# Patient Record
Sex: Female | Born: 1995 | Race: White | Hispanic: No | Marital: Single
Health system: Midwestern US, Community
[De-identification: ages and names within clinical notes are randomized; demographics above are authoritative.]

## PROBLEM LIST (undated history)

## (undated) DIAGNOSIS — I10 Essential (primary) hypertension: Secondary | ICD-10-CM

## (undated) DIAGNOSIS — J45909 Unspecified asthma, uncomplicated: Secondary | ICD-10-CM

## (undated) DIAGNOSIS — K589 Irritable bowel syndrome without diarrhea: Secondary | ICD-10-CM

## (undated) HISTORY — PX: CHOLECYSTECTOMY: SHX55

---
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
## 2019-03-11 ENCOUNTER — Ambulatory Visit (HOSPITAL_COMMUNITY)
Admission: EM | Admit: 2019-03-11 | Discharge: 2019-03-11 | Disposition: A | Discharge status: Home / Self Care | Payer: Federal, State, Local not specified - PPO | Attending: Family Medicine | Admitting: Family Medicine

## 2019-03-11 ENCOUNTER — Other Ambulatory Visit: Payer: Self-pay

## 2019-03-11 ENCOUNTER — Encounter (HOSPITAL_COMMUNITY): Payer: Self-pay

## 2019-03-11 DIAGNOSIS — K591 Functional diarrhea: Secondary | ICD-10-CM | POA: Diagnosis not present

## 2019-03-11 HISTORY — DX: Unspecified asthma, uncomplicated: J45.909

## 2019-03-11 HISTORY — DX: Irritable bowel syndrome without diarrhea: K58.9

## 2019-03-11 HISTORY — DX: Essential (primary) hypertension: I10

## 2019-03-11 MED ORDER — DIPHENOXYLATE-ATROPINE 2.5-0.025 MG PO TABS: 2.0000 | ORAL_TABLET | Freq: Four times a day (QID) | ORAL | 0 refills | Status: DC | PRN

## 2019-03-11 NOTE — ED Triage Notes (Signed)
Pt states she his having ISB flare ups x 3 days.

## 2019-03-11 NOTE — Discharge Instructions (Addendum)
Take Imodium, 2 tablets stat then 1 after each loose stool up to a max of 8/day

## 2019-03-11 NOTE — ED Provider Notes (Signed)
[facility]    CSN: [id number] Arrival date & time: 03/11/19  1308      History   Chief Complaint Chief Complaint  Patient presents with  . Diarrhea    HPI Chelsea Spencer is a 23 y.o. female.   Patient recently relocated to this area.  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  Diarrhea has been present for the past week.  She used small amount of Imodium.  There is also a history of gallbladder disease and sometimes it is hard for her to separate symptoms.  She has found that she needs to avoid fried and fatty and greasy foods.  HPI  Past Medical History:  Diagnosis Date  . Asthma   . Hypertension     There are no active problems to display for this patient.   Past Surgical History:  Procedure Laterality Date  . CHOLECYSTECTOMY      OB History   No obstetric history on file.      Home Medications    Prior to Admission medications   Not on File    Family History History reviewed. No pertinent family history.  Social History Social History   Tobacco Use  . Smoking status: Former Research scientist (life sciences)  . Smokeless tobacco: Never Used  Substance Use Topics  . Alcohol use: Never    Frequency: Never  . Drug use: Not on file     Allergies   Patient has no known allergies.   Review of Systems Review of Systems  Gastrointestinal: Positive for abdominal pain, diarrhea and vomiting.  All other systems reviewed and are negative.    Physical Exam Triage Vital Signs ED Triage Vitals  Enc Vitals Group     BP 03/11/19 1412 130/82     Pulse Rate 03/11/19 1412 71     Resp 03/11/19 1412 18     Temp 03/11/19 1412 98.3 F (36.8 C)     Temp src --      SpO2 03/11/19 1412 100 %     Weight 03/11/19 1410 260 lb (117.9 kg)     Height --      Head Circumference --      Peak Flow --      Pain Score 03/11/19 1410 4     Pain Loc --      Pain Edu? --      Excl. in [place]? --    No data found.  Updated Vital Signs BP 130/82 (BP  Location: Right Arm)   Pulse 71   Temp 98.3 F (36.8 C)   Resp 18   Wt 117.9 kg   LMP 03/11/2019   SpO2 100%   Visual Acuity Right Eye Distance:   Left Eye Distance:   Bilateral Distance:    Right Eye Near:   Left Eye Near:    Bilateral Near:     Physical Exam Vitals signs and nursing note reviewed.  Constitutional:      Appearance: Normal appearance. She is obese.  Cardiovascular:     Rate and Rhythm: Normal rate and regular rhythm.  Pulmonary:     Breath sounds: Normal breath sounds.  Abdominal:     General: Bowel sounds are normal.     Palpations: There is no mass.     Tenderness: There is no abdominal tenderness. There is no guarding or rebound.  Neurological:     Mental Status: She is alert.      UC Treatments / Results  Labs (all labs ordered are listed, but only abnormal results are displayed) Labs Reviewed - No data to display  EKG   Radiology No results found.  Procedures Procedures (including critical care time)  Medications Ordered in UC Medications - No data to display  Initial Impression / Assessment and Plan / UC Course  I have reviewed the triage vital signs and the nursing notes.  Pertinent labs & imaging results that were available during my care of the patient were reviewed by me and considered in my medical decision making (see chart for details).     Likely diagnosis is flareup of her irritable bowel syndrome with diarrhea.  I have suggested that she increase her Imodium taking 2 tablets to start with and 1 after each loose stool per day. Final Clinical Impressions(s) / UC Diagnoses   Final diagnoses:  None   Discharge Instructions   None    ED Prescriptions    None     PDMP not reviewed this encounter.   Frederica Kuster, MD 03/11/19 [phone]

## 2019-03-16 ENCOUNTER — Other Ambulatory Visit: Payer: Self-pay

## 2019-03-16 ENCOUNTER — Encounter (HOSPITAL_COMMUNITY): Payer: Self-pay

## 2019-03-16 ENCOUNTER — Ambulatory Visit (HOSPITAL_COMMUNITY)
Admission: EM | Admit: 2019-03-16 | Discharge: 2019-03-16 | Disposition: A | Discharge status: Home / Self Care | Payer: Federal, State, Local not specified - PPO | Attending: Family Medicine | Admitting: Family Medicine

## 2019-03-16 DIAGNOSIS — Z20822 Contact with and (suspected) exposure to covid-19: Secondary | ICD-10-CM

## 2019-03-16 DIAGNOSIS — J069 Acute upper respiratory infection, unspecified: Secondary | ICD-10-CM | POA: Diagnosis present

## 2019-03-16 DIAGNOSIS — R062 Wheezing: Secondary | ICD-10-CM | POA: Insufficient documentation

## 2019-03-16 DIAGNOSIS — J029 Acute pharyngitis, unspecified: Secondary | ICD-10-CM | POA: Diagnosis not present

## 2019-03-16 DIAGNOSIS — Z20828 Contact with and (suspected) exposure to other viral communicable diseases: Secondary | ICD-10-CM | POA: Diagnosis present

## 2019-03-16 LAB — POCT RAPID STREP A: Streptococcus, Group A Screen (Direct): NEGATIVE

## 2019-03-16 MED ORDER — PREDNISONE 20 MG PO TABS: 20.0000 mg | ORAL_TABLET | Freq: Two times a day (BID) | ORAL | 0 refills | Status: DC

## 2019-03-16 MED ORDER — ALBUTEROL SULFATE HFA 108 (90 BASE) MCG/ACT IN AERS: 1.0000 | INHALATION_SPRAY | Freq: Four times a day (QID) | RESPIRATORY_TRACT | 0 refills | Status: DC | PRN

## 2019-03-16 MED ORDER — ALBUTEROL SULFATE HFA 108 (90 BASE) MCG/ACT IN AERS: 1.0000 | INHALATION_SPRAY | Freq: Four times a day (QID) | RESPIRATORY_TRACT | Status: DC | PRN

## 2019-03-16 NOTE — ED Triage Notes (Signed)
Pt report cough, fever and sore throat.

## 2019-03-16 NOTE — Discharge Instructions (Addendum)
Drink plenty of fluids Take Tylenol for pain and fever Use albuterol as needed for any wheezing Take prednisone 2 times a day for 5 days. Th e coronavirus test will be helpful in 2 to 3 days.  We will call you if it is positive. Go home and quarantine until your test results are available

## 2019-03-16 NOTE — ED Provider Notes (Addendum)
[facility]    CSN: [id number] Arrival date & time: 03/16/19  1955      History   Chief Complaint Chief Complaint  Patient presents with  . Cough  . Sore Throat  . Fever    HPI Chelsea Spencer is a 23 y.o. female.   HPI  Patient works in a nursing home.  She started feeling sick today with cough fever and sore throat.  She is having bad coughing spells.  She is having shortness of breath.  She had a rapid coronavirus test performed at work today.  It was negative.  She was sent in for medical evaluation.  She feels fatigued.  No sweats or chills.  No body aches.  She does have a history of asthma when she was younger. Increased risk factors for coronavirus include asthma and morbid obesity  Past Medical History:  Diagnosis Date  . Asthma   . Hypertension   . IBS (irritable bowel syndrome)     There are no active problems to display for this patient.   Past Surgical History:  Procedure Laterality Date  . CHOLECYSTECTOMY      OB History   No obstetric history on file.      Home Medications    Prior to Admission medications   Medication Sig Start Date End Date Taking? Authorizing Provider  albuterol (VENTOLIN HFA) 108 (90 Base) MCG/ACT inhaler Inhale 1-2 puffs into the lungs every 6 (six) hours as needed for wheezing or shortness of breath. 03/16/19   Eustace Moore, MD  diphenoxylate-atropine (LOMOTIL) 2.5-0.025 MG tablet Take 2 tablets by mouth 4 (four) times daily as needed for diarrhea or loose stools. 03/11/19   Frederica Kuster, MD  predniSONE (DELTASONE) 20 MG tablet Take 1 tablet (20 mg total) by mouth 2 (two) times daily with a meal. 03/16/19   Eustace Moore, MD    Family History History reviewed. No pertinent family history.  Social History Social History   Tobacco Use  . Smoking status: Former Games developer  . Smokeless tobacco: Never Used  Substance Use Topics  . Alcohol use: Never    Frequency: Never  . Drug use: Not on file     Allergies   Penicillins   Review of Systems Review of Systems  Constitutional: Positive for fatigue. Negative for chills and fever.  HENT: Positive for sore throat. Negative for ear pain.   Eyes: Negative for pain and visual disturbance.  Respiratory: Positive for cough, shortness of breath and wheezing.   Cardiovascular: Negative for chest pain and palpitations.  Gastrointestinal: Negative for abdominal pain and vomiting.  Genitourinary: Negative for dysuria and hematuria.  Musculoskeletal: Negative for arthralgias and back pain.  Skin: Negative for color change and rash.  Neurological: Negative for seizures and syncope.  All other systems reviewed and are negative.    Physical Exam Triage Vital Signs ED Triage Vitals  Enc Vitals Group     BP 03/16/19 2007 (!) 150/77     Pulse Rate 03/16/19 2007 98     Resp 03/16/19 2007 16     Temp 03/16/19 2007 98.7 F (37.1 C)     Temp Source 03/16/19 2007 Oral     SpO2 03/16/19 2007 97 %     Weight --      Height --      Head Circumference --      Peak Flow --      Pain Score 03/16/19 2005 8     Pain  Loc --      Pain Edu? --      Excl. in GC? --    No data found.  Updated Vital Signs BP (!) 150/77 (BP Location: Right Arm)   Pulse 98   Temp 98.7 F (37.1 C) (Oral)   Resp 16   LMP 03/11/2019   SpO2 97%   Visual Acuity Right Eye Distance:   Left Eye Distance:   Bilateral Distance:    Right Eye Near:   Left Eye Near:    Bilateral Near:     Physical Exam Constitutional:      General: She is not in acute distress.    Appearance: She is well-developed. She is obese. She is ill-appearing.  HENT:     Head: Normocephalic and atraumatic.     Right Ear: Tympanic membrane and ear canal normal.     Left Ear: Tympanic membrane and ear canal normal.     Nose: No congestion.     Mouth/Throat:     Mouth: Mucous membranes are moist.     Pharynx: Uvula midline. No posterior oropharyngeal erythema.  Eyes:      Conjunctiva/sclera: Conjunctivae normal.     Pupils: Pupils are equal, round, and reactive to light.  Neck:     Musculoskeletal: Normal range of motion.  Cardiovascular:     Rate and Rhythm: Normal rate and regular rhythm.     Heart sounds: Normal heart sounds.  Pulmonary:     Effort: Pulmonary effort is normal. No respiratory distress.     Breath sounds: Wheezing and rhonchi present.     Comments: Scattered wheeze and rhonchi throughout Abdominal:     General: Bowel sounds are normal. There is no distension.     Palpations: Abdomen is soft.     Tenderness: There is no abdominal tenderness.  Musculoskeletal: Normal range of motion.  Skin:    General: Skin is warm and dry.  Neurological:     Mental Status: She is alert.     Gait: Gait normal.  Psychiatric:        Mood and Affect: Mood normal.        Behavior: Behavior normal.      UC Treatments / Results  Labs (all labs ordered are listed, but only abnormal results are displayed) Labs Reviewed  NOVEL CORONAVIRUS, NAA (HOSP ORDER, SEND-OUT TO REF LAB; TAT 18-24 HRS)  CULTURE, GROUP A STREP [facility])  POCT RAPID STREP A    EKG   Radiology No results found.  Procedures Procedures (including critical care time)  Medications Ordered in UC Medications - No data to display  Initial Impression / Assessment and Plan / UC Course  I have reviewed the triage vital signs and the nursing notes.  Pertinent labs & imaging results that were available during my care of the patient were reviewed by me and considered in my medical decision making (see chart for details).     We will give albuterol for the wheezing.  5 days of prednisone for the wheezing.  I believe this is more of an upper respiratory virus, not likely to be coronavirus.  We did do coronavirus testing and have asked her to quarantine until test result is available. Final Clinical Impressions(s) / UC Diagnoses   Final diagnoses:  Viral upper respiratory tract  infection  Wheezing  Suspected COVID-19 virus infection     Discharge Instructions     Drink plenty of fluids Take Tylenol for pain and fever Use albuterol as needed  for any wheezing Take prednisone 2 times a day for 5 days. Th e coronavirus test will be helpful in 2 to 3 days.  We will call you if it is positive. Go home and quarantine until your test results are available    ED Prescriptions    Medication Sig Dispense Auth. Provider   predniSONE (DELTASONE) 20 MG tablet Take 1 tablet (20 mg total) by mouth 2 (two) times daily with a meal. 10 tablet Raylene Everts, MD   albuterol (VENTOLIN HFA) 108 (90 Base) MCG/ACT inhaler  (Status: Discontinued) Inhale 1-2 puffs into the lungs every 6 (six) hours as needed for wheezing or shortness of breath. 18 g Raylene Everts, MD   albuterol (VENTOLIN HFA) 108 (90 Base) MCG/ACT inhaler  (Status: Discontinued) Inhale 1-2 puffs into the lungs every 6 (six) hours as needed for wheezing or shortness of breath. 18 g Raylene Everts, MD   albuterol (VENTOLIN HFA) 108 (90 Base) MCG/ACT inhaler Inhale 1-2 puffs into the lungs every 6 (six) hours as needed for wheezing or shortness of breath. 18 g Raylene Everts, MD     PDMP not reviewed this encounter.   Raylene Everts, MD 03/16/19 2026    Raylene Everts, MD 03/16/19 2027

## 2019-03-19 LAB — CULTURE, GROUP A STREP (THRC)

## 2019-03-19 LAB — NOVEL CORONAVIRUS, NAA (HOSP ORDER, SEND-OUT TO REF LAB; TAT 18-24 HRS): SARS-CoV-2, NAA: NOT DETECTED

## 2019-06-06 ENCOUNTER — Encounter (HOSPITAL_COMMUNITY): Payer: Self-pay | Admitting: Emergency Medicine

## 2019-06-06 ENCOUNTER — Ambulatory Visit (HOSPITAL_COMMUNITY)
Admission: EM | Admit: 2019-06-06 | Discharge: 2019-06-06 | Disposition: A | Discharge status: Home / Self Care | Payer: Federal, State, Local not specified - PPO | Attending: Family Medicine | Admitting: Family Medicine

## 2019-06-06 ENCOUNTER — Other Ambulatory Visit: Payer: Self-pay

## 2019-06-06 DIAGNOSIS — I10 Essential (primary) hypertension: Secondary | ICD-10-CM | POA: Diagnosis not present

## 2019-06-06 DIAGNOSIS — Z20828 Contact with and (suspected) exposure to other viral communicable diseases: Secondary | ICD-10-CM | POA: Insufficient documentation

## 2019-06-06 DIAGNOSIS — R05 Cough: Secondary | ICD-10-CM | POA: Diagnosis present

## 2019-06-06 DIAGNOSIS — J45909 Unspecified asthma, uncomplicated: Secondary | ICD-10-CM | POA: Diagnosis not present

## 2019-06-06 DIAGNOSIS — R062 Wheezing: Secondary | ICD-10-CM | POA: Diagnosis present

## 2019-06-06 DIAGNOSIS — Z87891 Personal history of nicotine dependence: Secondary | ICD-10-CM | POA: Diagnosis not present

## 2019-06-06 DIAGNOSIS — Z880 Allergy status to penicillin: Secondary | ICD-10-CM | POA: Diagnosis not present

## 2019-06-06 DIAGNOSIS — J019 Acute sinusitis, unspecified: Secondary | ICD-10-CM | POA: Diagnosis present

## 2019-06-06 DIAGNOSIS — R059 Cough, unspecified: Secondary | ICD-10-CM

## 2019-06-06 MED ORDER — DOXYCYCLINE HYCLATE 100 MG PO CAPS: 100.0000 mg | ORAL_CAPSULE | Freq: Two times a day (BID) | ORAL | 0 refills | Status: DC

## 2019-06-06 MED ORDER — BENZONATATE 100 MG PO CAPS: 100.0000 mg | ORAL_CAPSULE | Freq: Three times a day (TID) | ORAL | 0 refills | Status: AC | PRN

## 2019-06-06 MED ORDER — PROMETHAZINE-DM 6.25-15 MG/5ML PO SYRP: 5.0000 mL | ORAL_SOLUTION | Freq: Every evening | ORAL | 0 refills | Status: AC | PRN

## 2019-06-06 MED ORDER — ALBUTEROL SULFATE HFA 108 (90 BASE) MCG/ACT IN AERS: 1.0000 | INHALATION_SPRAY | Freq: Four times a day (QID) | RESPIRATORY_TRACT | 0 refills | Status: AC | PRN

## 2019-06-06 MED ORDER — PREDNISONE 20 MG PO TABS: ORAL_TABLET | ORAL | 0 refills | Status: AC

## 2019-06-06 NOTE — ED Triage Notes (Signed)
PT reports coughing, sneezing, wheezing, productive cough. Symptoms for 2 weeks.   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 She has had negative thus far. She got tested yesterday, does not know results. Elects to get tested here.

## 2019-06-06 NOTE — Discharge Instructions (Signed)
Start an antihistamine like Zyrtec (cetirizine) at 10mg daily for postnasal drainage, sinus congestion.  You can take this together with pseudoephedrine (Sudafed) at a dose of 60 mg 3 times a day or twice daily as needed for the same kind of congestion. 

## 2019-06-06 NOTE — ED Provider Notes (Signed)
[facility]   MRN: [id number] DOB: Oct 13, 1995  Subjective:   Chelsea Spencer is a 23 y.o. female presenting for 2 week hx of persistent productive cough, wheezing, shortness of breath. Has also had difficulty with sneezing multiple times every day.  Has a hx of childhood asthma, hx of allergies. Was given an albuterol inhaler and has helped. Has been using benadryl and Aleve with some relief. 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 Has a hx of smoking, quit 1 year ago.   No current facility-administered medications for this encounter.  Current Outpatient Medications:  .  albuterol (VENTOLIN HFA) 108 (90 Base) MCG/ACT inhaler, Inhale 1-2 puffs into the lungs every 6 (six) hours as needed for wheezing or shortness of breath., Disp: 18 g, Rfl: 0   Allergies  Allergen Reactions  . Penicillins Anaphylaxis and Nausea And Vomiting    Past Medical History:  Diagnosis Date  . Asthma   . Hypertension   . IBS (irritable bowel syndrome)      Past Surgical History:  Procedure Laterality Date  . CHOLECYSTECTOMY      No family history on file.  Social History   Tobacco Use  . Smoking status: Former Games developer  . Smokeless tobacco: Never Used  Substance Use Topics  . Alcohol use: Never  . Drug use: Not on file    Review of Systems  Constitutional: Negative for fever and malaise/fatigue.  HENT: Positive for congestion. Negative for ear discharge, ear pain, sinus pain, sore throat and tinnitus.   Eyes: Negative for discharge and redness.  Respiratory: Positive for cough, shortness of breath and wheezing. Negative for hemoptysis.   Cardiovascular: Positive for chest pain (from coughing).  Gastrointestinal: Negative for abdominal pain, diarrhea, nausea and vomiting.  Genitourinary: Negative for dysuria, flank pain and hematuria.  Musculoskeletal: Positive for myalgias (from her coughing).  Skin: Negative for rash.  Neurological: Negative for  dizziness, weakness and headaches.  Psychiatric/Behavioral: Negative for depression and substance abuse.     Objective:   Vitals: BP 139/89   Pulse 89   Temp 98.1 F (36.7 C) (Oral)   Resp 16   LMP 05/11/2019   SpO2 100%   Physical Exam Constitutional:      General: She is not in acute distress.    Appearance: Normal appearance. She is well-developed. She is not ill-appearing, toxic-appearing or diaphoretic.  HENT:     Head: Normocephalic and atraumatic.     Nose: Congestion and rhinorrhea present.     Comments: Erythematous nasal mucosa with purulent discharge.    Mouth/Throat:     Mouth: Mucous membranes are moist.     Pharynx: Posterior oropharyngeal erythema (with pnd) present. No oropharyngeal exudate.  Eyes:     General: No scleral icterus.       Right eye: No discharge.        Left eye: No discharge.     Extraocular Movements: Extraocular movements intact.     Pupils: Pupils are equal, round, and reactive to light.  Cardiovascular:     Rate and Rhythm: Normal rate and regular rhythm.     Pulses: Normal pulses.     Heart sounds: Normal heart sounds. No murmur. No friction rub. No gallop.   Pulmonary:     Effort: Pulmonary effort is normal. No respiratory distress.     Breath sounds: Normal breath sounds. No stridor. No wheezing, rhonchi or rales.  Skin:    General: Skin is warm and  dry.     Findings: No rash.  Neurological:     Mental Status: She is alert and oriented to person, place, and time.  Psychiatric:        Mood and Affect: Mood normal.        Behavior: Behavior normal.        Thought Content: Thought content normal.        Judgment: Judgment normal.     Assessment and Plan :   1. Acute non-recurrent sinusitis, unspecified location   2. Cough   3. Wheezing     Will cover for sinusitis with doxycycline given her allergies.  Also will use prednisone course to help with her cough, sinusitis, wheezing and shortness of breath.  Refilled her  albuterol inhaler.  Use supportive care medications otherwise. Counseled patient on potential for adverse effects with medications prescribed/recommended today, ER and return-to-clinic precautions discussed, patient verbalized understanding.    Jaynee Eagles, [place] 06/06/19 [phone]

## 2019-06-08 LAB — NOVEL CORONAVIRUS, NAA (HOSP ORDER, SEND-OUT TO REF LAB; TAT 18-24 HRS): SARS-CoV-2, NAA: NOT DETECTED

## 2019-11-06 ENCOUNTER — Other Ambulatory Visit: Payer: Self-pay

## 2019-11-06 ENCOUNTER — Emergency Department (HOSPITAL_COMMUNITY): Payer: Federal, State, Local not specified - PPO

## 2019-11-06 ENCOUNTER — Emergency Department (HOSPITAL_COMMUNITY)
Admission: EM | Admit: 2019-11-06 | Discharge: 2019-11-06 | Disposition: A | Discharge status: Home / Self Care | Payer: Federal, State, Local not specified - PPO | Attending: Emergency Medicine | Admitting: Emergency Medicine

## 2019-11-06 ENCOUNTER — Encounter (HOSPITAL_COMMUNITY): Payer: Self-pay

## 2019-11-06 DIAGNOSIS — I10 Essential (primary) hypertension: Secondary | ICD-10-CM | POA: Insufficient documentation

## 2019-11-06 DIAGNOSIS — Z87891 Personal history of nicotine dependence: Secondary | ICD-10-CM | POA: Diagnosis not present

## 2019-11-06 DIAGNOSIS — J45909 Unspecified asthma, uncomplicated: Secondary | ICD-10-CM | POA: Insufficient documentation

## 2019-11-06 DIAGNOSIS — R1011 Right upper quadrant pain: Secondary | ICD-10-CM | POA: Diagnosis not present

## 2019-11-06 DIAGNOSIS — Z79899 Other long term (current) drug therapy: Secondary | ICD-10-CM | POA: Diagnosis not present

## 2019-11-06 DIAGNOSIS — R112 Nausea with vomiting, unspecified: Secondary | ICD-10-CM | POA: Diagnosis not present

## 2019-11-06 DIAGNOSIS — R197 Diarrhea, unspecified: Secondary | ICD-10-CM | POA: Insufficient documentation

## 2019-11-06 LAB — LIPASE, BLOOD: Lipase: 27 U/L (ref 11–51)

## 2019-11-06 LAB — TROPONIN I (HIGH SENSITIVITY)
Troponin I (High Sensitivity): 2 ng/L (ref <18)
Troponin I (High Sensitivity): <2 ng/L (ref <18)

## 2019-11-06 LAB — URINALYSIS, ROUTINE W REFLEX MICROSCOPIC
Bilirubin Urine: NEGATIVE
Glucose, UA: NEGATIVE mg/dL
Hgb urine dipstick: NEGATIVE
Ketones, ur: NEGATIVE mg/dL
Leukocytes,Ua: NEGATIVE
Nitrite: NEGATIVE
Protein, ur: NEGATIVE mg/dL
Specific Gravity, Urine: 1.019 (ref 1.005–1.030)
pH: 7 (ref 5.0–8.0)

## 2019-11-06 LAB — COMPREHENSIVE METABOLIC PANEL
ALT: 77 U/L — ABNORMAL HIGH (ref 0–44)
AST: 59 U/L — ABNORMAL HIGH (ref 15–41)
Albumin: 4 g/dL (ref 3.5–5.0)
Alkaline Phosphatase: 63 U/L (ref 38–126)
Anion gap: 10 (ref 5–15)
BUN: 6 mg/dL (ref 6–20)
CO2: 24 mmol/L (ref 22–32)
Calcium: 9.4 mg/dL (ref 8.9–10.3)
Chloride: 105 mmol/L (ref 98–111)
Creatinine, Ser: 0.61 mg/dL (ref 0.44–1.00)
GFR calc Af Amer: >60 mL/min (ref >60)
GFR calc non Af Amer: >60 mL/min (ref >60)
Glucose, Bld: 97 mg/dL (ref 70–99)
Potassium: 4.5 mmol/L (ref 3.5–5.1)
Sodium: 139 mmol/L (ref 135–145)
Total Bilirubin: 0.6 mg/dL (ref 0.3–1.2)
Total Protein: 7.7 g/dL (ref 6.5–8.1)

## 2019-11-06 LAB — I-STAT BETA HCG BLOOD, ED (MC, WL, AP ONLY): I-stat hCG, quantitative: <5 m[IU]/mL (ref <5)

## 2019-11-06 LAB — CBC
HCT: 41.5 % (ref 36.0–46.0)
Hemoglobin: 13.4 g/dL (ref 12.0–15.0)
MCH: 28 pg (ref 26.0–34.0)
MCHC: 32.3 g/dL (ref 30.0–36.0)
MCV: 86.8 fL (ref 80.0–100.0)
Platelets: 263 10*3/uL (ref 150–400)
RBC: 4.78 MIL/uL (ref 3.87–5.11)
RDW: 13.1 % (ref 11.5–15.5)
WBC: 8 10*3/uL (ref 4.0–10.5)
nRBC: 0 % (ref 0.0–0.2)

## 2019-11-06 MED ORDER — HYDROCODONE-ACETAMINOPHEN 5-325 MG PO TABS: 1.0000 | ORAL_TABLET | ORAL | 0 refills | Status: AC | PRN

## 2019-11-06 MED ORDER — ONDANSETRON HCL 4 MG/2ML IJ SOLN
4.0000 mg | Freq: Once | INTRAMUSCULAR | Status: AC
  Administered 2019-11-06: 4 mg via INTRAVENOUS
  Filled 2019-11-06: qty 2

## 2019-11-06 MED ORDER — SODIUM CHLORIDE 0.9% FLUSH
3.0000 mL | Freq: Once | INTRAVENOUS | Status: AC
  Administered 2019-11-06: 3 mL via INTRAVENOUS

## 2019-11-06 MED ORDER — FENTANYL CITRATE (PF) 100 MCG/2ML IJ SOLN
100.0000 ug | INTRAMUSCULAR | Status: DC | PRN
  Administered 2019-11-06: 100 ug via INTRAVENOUS
  Filled 2019-11-06: qty 2

## 2019-11-06 MED ORDER — SODIUM CHLORIDE 0.9 % IV BOLUS
1000.0000 mL | Freq: Once | INTRAVENOUS | Status: AC
  Administered 2019-11-06: 1000 mL via INTRAVENOUS

## 2019-11-06 MED ORDER — ONDANSETRON HCL 8 MG PO TABS: 8.0000 mg | ORAL_TABLET | Freq: Three times a day (TID) | ORAL | 0 refills | Status: AC | PRN

## 2019-11-06 MED ORDER — HYDROCODONE-ACETAMINOPHEN 5-325 MG PO TABS: 1.0000 | ORAL_TABLET | ORAL | 0 refills | Status: DC | PRN

## 2019-11-06 NOTE — Discharge Instructions (Signed)
The testing did not show gallbladder problems today.  You continue to have hepatic steatosis, which is not likely to be a painful condition.  We are prescribing a pain reliever and nausea pill to take to improve your symptoms.  Do not drive when taking the narcotic pain reliever.  Start with a simple clear liquid diet and gradually advance to regular foods as tolerated.

## 2019-11-06 NOTE — ED Triage Notes (Signed)
Patient complains of RLQ pain with radiation to back since Saturday with vomiting and diarrhea. Patient states she thinks its her gallbladder

## 2019-11-06 NOTE — ED Notes (Signed)
Pt transported to Ultrasound.  

## 2019-11-06 NOTE — ED Provider Notes (Signed)
[facility] EM[facility]ENC[facility] DEPARTMENT Provider Note   CSN: [id number] Arrival date & time: 11/06/19  1022     History Chief Complaint  Patient presents with  . Abdominal Pain    Chelsea Spencer is a 24 y.o. female.  HPI She presents for evaluation of persistent right upper quadrant abdominal pain present for 2 days, with associated nausea, vomiting and diarrhea.  No blood in emesis or stool.  No fever.  Similar problem about 7 years ago when she was evaluated for both kidney stones and gallbladder problems.  No other problems with kidney stones none since that time.  She has not had any surgery on her abdomen.  She has not been able eat for several days because of nausea.  There are no other known modifying factors.    Past Medical History:  Diagnosis Date  . Asthma   . Hypertension   . IBS (irritable bowel syndrome)     There are no problems to display for this patient.   Past Surgical History:  Procedure Laterality Date  . CHOLECYSTECTOMY       OB History   No obstetric history on file.     No family history on file.  Social History   Tobacco Use  . Smoking status: Former Research scientist (life sciences)  . Smokeless tobacco: Never Used  Substance Use Topics  . Alcohol use: Never  . Drug use: Not on file    Home Medications Prior to Admission medications   Medication Sig Start Date End Date Taking? Authorizing Provider  albuterol (VENTOLIN HFA) 108 (90 Base) MCG/ACT inhaler Inhale 1-2 puffs into the lungs every 6 (six) hours as needed for wheezing or shortness of breath. 06/06/19   Jaynee Eagles, PA-C  benzonatate (TESSALON) 100 MG capsule Take 1-2 capsules (100-200 mg total) by mouth 3 (three) times daily as needed. 06/06/19   Jaynee Eagles, PA-C  doxycycline (VIBRAMYCIN) 100 MG capsule Take 1 capsule (100 mg total) by mouth 2 (two) times daily. 06/06/19   Jaynee Eagles, PA-C  HYDROcodone-acetaminophen (NORCO/VICODIN) 5-325 MG tablet Take 1 tablet by mouth every 4 (four) hours  as needed for moderate pain. 11/06/19   Daleen Bo, MD  ondansetron (ZOFRAN) 8 MG tablet Take 1 tablet (8 mg total) by mouth every 8 (eight) hours as needed for nausea or vomiting. 11/06/19   Daleen Bo, MD  predniSONE (DELTASONE) 20 MG tablet Take 2 tablets daily with breakfast. 06/06/19   Jaynee Eagles, PA-C  promethazine-dextromethorphan (PROMETHAZINE-DM) 6.25-15 MG/5ML syrup Take 5 mLs by mouth at bedtime as needed for cough. 06/06/19   Jaynee Eagles, PA-C    Allergies    Penicillins  Review of Systems   Review of Systems  All other systems reviewed and are negative.   Physical Exam Updated Vital Signs BP 100/66   Pulse 62   Temp 98 F (36.7 C) (Oral)   Resp 19   Ht 5\' 7"  (1.702 m)   Wt 117.9 kg   SpO2 97%   BMI 40.72 kg/m   Physical Exam Vitals and nursing note reviewed.  Constitutional:      General: She is not in acute distress.    Appearance: She is well-developed. She is obese. She is not ill-appearing, toxic-appearing or diaphoretic.  HENT:     Head: Normocephalic and atraumatic.     Right Ear: External ear normal.     Left Ear: External ear normal.  Eyes:     Conjunctiva/sclera: Conjunctivae normal.     Pupils: Pupils are  equal, round, and reactive to light.  Neck:     Trachea: Phonation normal.  Cardiovascular:     Rate and Rhythm: Normal rate and regular rhythm.     Heart sounds: Normal heart sounds.  Pulmonary:     Effort: Pulmonary effort is normal.     Breath sounds: Normal breath sounds.  Abdominal:     General: There is no distension.     Palpations: Abdomen is soft. There is no mass.     Tenderness: There is abdominal tenderness (Right upper quadrant, moderate). There is guarding. There is no rebound.  Musculoskeletal:        General: Normal range of motion.     Cervical back: Normal range of motion and neck supple.  Skin:    General: Skin is warm and dry.  Neurological:     Mental Status: She is alert and oriented to person, place, and  time.     Cranial Nerves: No cranial nerve deficit.     Sensory: No sensory deficit.     Motor: No abnormal muscle tone.     Coordination: Coordination normal.  Psychiatric:        Mood and Affect: Mood normal.        Behavior: Behavior normal.        Thought Content: Thought content normal.        Judgment: Judgment normal.     ED Results / Procedures / Treatments   Labs (all labs ordered are listed, but only abnormal results are displayed) Labs Reviewed  COMPREHENSIVE METABOLIC PANEL - Abnormal; Notable for the following components:      Result Value   AST 59 (*)    ALT 77 (*)    All other components within normal limits  URINALYSIS, ROUTINE W REFLEX MICROSCOPIC - Abnormal; Notable for the following components:   APPearance HAZY (*)    All other components within normal limits  LIPASE, BLOOD  CBC  I-STAT BETA HCG BLOOD, ED ([facility], [facility], [facility] ONLY)  TROPONIN I (HIGH SENSITIVITY)  TROPONIN I (HIGH SENSITIVITY)    EKG EKG Interpretation  Date/Time:  Monday Nov 06 2019 10:57:32 EDT Ventricular Rate:  69 PR Interval:    QRS Duration: 99 QT Interval:  409 QTC Calculation: 439 R Axis:   37 Text Interpretation: Sinus rhythm Abnormal Q suggests inferior infarct No old tracing to compare Confirmed by Mancel Bale [phone]) on 11/06/2019 11:13:06 AM   Radiology US Abdomen Complete  Result Date: 11/06/2019 CLINICAL DATA:  24 year old female with acute abdominal pain for 2 days. EXAM: ABDOMEN ULTRASOUND COMPLETE COMPARISON:  None. FINDINGS: Gallbladder: The gallbladder is unremarkable. There is no evidence of cholelithiasis or acute cholecystitis. Common bile duct: Diameter: 4 mm. No intrahepatic or extrahepatic biliary dilatation. Liver: Increased hepatic echogenicity is compatible with hepatic steatosis. Focal sparing adjacent to the gallbladder is noted. No suspicious focal hepatic abnormalities are present. Portal vein is patent on color Doppler imaging with normal direction of blood  flow towards the liver. IVC: No abnormality visualized. Pancreas: Visualized portion unremarkable. Spleen: Size and appearance within normal limits. Right Kidney: Length: 12 cm. Echogenicity within normal limits. No mass or hydronephrosis visualized. Left Kidney: Length: 12.9 cm. Echogenicity within normal limits. No mass or hydronephrosis visualized. Abdominal aorta: No aneurysm visualized. Other findings: None. IMPRESSION: 1. No evidence of acute abnormality.  Unremarkable gallbladder. 2. Hepatic steatosis. Electronically Signed   By: Harmon Pier M.D.   On: 11/06/2019 14:03    Procedures Procedures (including critical care  time)  Medications Ordered in ED Medications  fentaNYL (SUBLIMAZE) injection 100 mcg (100 mcg Intravenous Given 11/06/19 1225)  sodium chloride flush (NS) 0.9 % injection 3 mL (3 mLs Intravenous Given 11/06/19 1228)  ondansetron (ZOFRAN) injection 4 mg (4 mg Intravenous Given 11/06/19 1224)  sodium chloride 0.9 % bolus 1,000 mL (0 mLs Intravenous Stopped 11/06/19 1444)    ED Course  I have reviewed the triage vital signs and the nursing notes.  Pertinent labs & imaging results that were available during my care of the patient were reviewed by me and considered in my medical decision making (see chart for details).  Clinical Course as of Nov 06 1531  Mon Nov 06, 2019  1514 Normal delta troponin  Troponin I (High Sensitivity) [EW]  1514 Normal  CBC [EW]  1514 Normal   [EW]  1514 Minimal elevation in AST and ALT, otherwise normal  Comprehensive metabolic panel(!) [EW]  1514 Normal  Lipase, blood [EW]  1514 Normal  Urinalysis, Routine w reflex microscopic(!) [EW]  1514 Per radiologist, no acute changes, hepatic steatosis is present  US Abdomen Complete [EW]  1516 MCH: 28.0 [EW]    Clinical Course User Index [EW] Mancel Bale, MD   MDM Rules/Calculators/A&P                       Patient Vitals for the past 24 hrs:  BP Temp Temp src Pulse Resp SpO2 Height  Weight  11/06/19 1315 100/66 -- -- 62 19 97 % -- --  11/06/19 1300 110/72 -- -- 65 17 96 % -- --  11/06/19 1249 123/87 -- -- 83 16 97 % -- --  11/06/19 1026 (!) 148/86 98 F (36.7 C) Oral 78 18 98 % 5\' 7"  (1.702 m) 117.9 kg    3:33 PM Reevaluation with update and discussion. After initial assessment and treatment, an updated evaluation reveals she appears more comfortable now, and has no further complaints.  Findings discussed and questions answered.   Medical Decision Making:  This patient is presenting for evaluation of abdominal pain, nausea and vomiting, which does require a range of treatment options, and is a complaint that involves a moderate risk of morbidity and mortality. The differential diagnoses include gallbladder disease, peptic ulcer, cardiac disorder, enteritis. I decided to review old records, and in summary healthy obese female with history of hepatic disorder, status post liver biopsy.  I did not require additional historical information from anyone.  Clinical Laboratory Tests Ordered, included CBC, Metabolic panel and Pregnancy test, troponin, urinalysis. Review indicates essentially normal except mild elevation of AST and ALT.Mancel Bale Radiologic Tests Ordered, included abdominal ultrasound.  I independently Visualized: Ultrasound images, which show hepatic steatosis, otherwise normal findings per radiologist report  Cardiac Monitor Tracing which shows normal sinus rhythm    Critical Interventions-clinical evaluation, laboratory testing, ultrasound imaging, IV fluids, IV analgesia and antiemetic, observation, reassessment  After These Interventions, the Patient was reevaluated and was found to appear more comfortable, and without acute problems requiring surgery or hospitalization.  Doubt gallbladder disease, UTI, metabolic instability or hemodynamic collapse.  Mild hepatic steatosis present, this can be managed as an outpatient.  It is not likely to be a source  of her pain.  CRITICAL CARE-no Performed by: Marland Kitchen  Nursing Notes Reviewed/ Care Coordinated Applicable Imaging Reviewed Interpretation of Laboratory Data incorporated into ED treatment  The patient appears reasonably screened and/or stabilized for discharge and I doubt any other medical condition or  other [facility] requiring further screening, evaluation, or treatment in the ED at this time prior to discharge.  Plan: Home Medications-continue usual; Home Treatments-gradual advance diet and activity; return here if the recommended treatment, does not improve the symptoms; Recommended follow up-gastroenterology follow-up in 3 to 4 days.     Final Clinical Impression(s) / ED Diagnoses Final diagnoses:  Right upper quadrant abdominal pain  Nausea vomiting and diarrhea    Rx / DC Orders ED Discharge Orders         Ordered    HYDROcodone-acetaminophen (NORCO/VICODIN) 5-325 MG tablet  Every 4 hours PRN,   Status:  Discontinued     11/06/19 1527    ondansetron (ZOFRAN) 8 MG tablet  Every 8 hours PRN     11/06/19 1527    HYDROcodone-acetaminophen (NORCO/VICODIN) 5-325 MG tablet  Every 4 hours PRN     11/06/19 1530           Mancel Bale, MD 11/06/19 1533

## 2019-11-06 NOTE — ED Notes (Signed)
Verbalized understanding of DC instructions, Rx, follow up care with GI

## 2019-11-07 ENCOUNTER — Encounter: Payer: Self-pay | Admitting: Nurse Practitioner

## 2019-11-30 ENCOUNTER — Ambulatory Visit: Payer: Federal, State, Local not specified - PPO | Admitting: Nurse Practitioner

## 2020-07-12 ENCOUNTER — Emergency Department (HOSPITAL_COMMUNITY)
Admission: EM | Admit: 2020-07-12 | Discharge: 2020-07-13 | Disposition: A | Discharge status: Home / Self Care | Payer: Federal, State, Local not specified - PPO | Attending: Pediatric Emergency Medicine | Admitting: Pediatric Emergency Medicine

## 2020-07-12 ENCOUNTER — Other Ambulatory Visit: Payer: Self-pay

## 2020-07-12 DIAGNOSIS — W540XXA Bitten by dog, initial encounter: Secondary | ICD-10-CM | POA: Insufficient documentation

## 2020-07-12 DIAGNOSIS — Z87891 Personal history of nicotine dependence: Secondary | ICD-10-CM | POA: Diagnosis not present

## 2020-07-12 DIAGNOSIS — J45909 Unspecified asthma, uncomplicated: Secondary | ICD-10-CM | POA: Diagnosis not present

## 2020-07-12 DIAGNOSIS — S60921A Unspecified superficial injury of right hand, initial encounter: Secondary | ICD-10-CM | POA: Diagnosis present

## 2020-07-12 DIAGNOSIS — I10 Essential (primary) hypertension: Secondary | ICD-10-CM | POA: Diagnosis not present

## 2020-07-12 NOTE — ED Notes (Signed)
Pt up to desk stating that she is bleeding through bandage. Bandage removed. Wound cleansed and dressed with gauze and coban.

## 2020-07-12 NOTE — ED Triage Notes (Signed)
Pt presents to ED POV. Pt c/o dog bite to R hand. Pt reports that dog was UTD on rabies. Pt reports last tetanus 2018, bleeding controlled.

## 2020-07-13 ENCOUNTER — Emergency Department (HOSPITAL_COMMUNITY): Payer: Federal, State, Local not specified - PPO

## 2020-07-13 MED ORDER — IBUPROFEN 400 MG PO TABS
400.0000 mg | ORAL_TABLET | Freq: Once | ORAL | Status: AC
  Administered 2020-07-13: 400 mg via ORAL

## 2020-07-13 MED ORDER — CLINDAMYCIN HCL 300 MG PO CAPS: 300.0000 mg | ORAL_CAPSULE | Freq: Three times a day (TID) | ORAL | 0 refills | Status: AC

## 2020-07-13 MED ORDER — SULFAMETHOXAZOLE-TRIMETHOPRIM 800-160 MG PO TABS: 1.0000 | ORAL_TABLET | Freq: Two times a day (BID) | ORAL | 0 refills | Status: AC

## 2020-07-13 NOTE — ED Provider Notes (Signed)
[facility] EMERGENCY DEPARTMENT Provider Note   CSN: [id number] Arrival date & time: 07/12/20  1736     History Chief Complaint  Patient presents with  . Animal Bite    Chelsea Spencer is a 25 y.o. female dog bite to R hand.  No other injuries.    The history is provided by the patient.  Animal Bite Contact animal:  Dog Location:  Hand Hand injury location:  R hand Time since incident:  15 hours Pain details:    Quality:  Aching   Severity:  Moderate   Timing:  Constant   Progression:  Waxing and waning Incident location:  Another residence Provoked: unprovoked   Notifications:  Animal control Animal's rabies vaccination status:  Up to date Animal in possession: yes   Tetanus status:  Up to date Relieved by:  Nothing Worsened by:  Nothing Ineffective treatments:  None tried Associated symptoms: no fever and no rash        Past Medical History:  Diagnosis Date  . Asthma   . Hypertension   . IBS (irritable bowel syndrome)     There are no problems to display for this patient.   Past Surgical History:  Procedure Laterality Date  . CHOLECYSTECTOMY       OB History   No obstetric history on file.     No family history on file.  Social History   Tobacco Use  . Smoking status: Former Games developer  . Smokeless tobacco: Never Used  Substance Use Topics  . Alcohol use: Never    Home Medications Prior to Admission medications   Medication Sig Start Date End Date Taking? Authorizing Provider  clindamycin (CLEOCIN) 300 MG capsule Take 1 capsule (300 mg total) by mouth 3 (three) times daily for 5 days. 07/13/20 07/18/20 Yes Reichert, Wyvonnia Dusky, MD  sulfamethoxazole-trimethoprim (BACTRIM DS) 800-160 MG tablet Take 1 tablet by mouth 2 (two) times daily for 5 days. 07/13/20 07/18/20 Yes Reichert, Wyvonnia Dusky, MD  albuterol (VENTOLIN HFA) 108 (90 Base) MCG/ACT inhaler Inhale 1-2 puffs into the lungs every 6 (six) hours as needed for wheezing or shortness of  breath. 06/06/19   Wallis Bamberg, PA-C  benzonatate (TESSALON) 100 MG capsule Take 1-2 capsules (100-200 mg total) by mouth 3 (three) times daily as needed. 06/06/19   Wallis Bamberg, PA-C  HYDROcodone-acetaminophen (NORCO/VICODIN) 5-325 MG tablet Take 1 tablet by mouth every 4 (four) hours as needed for moderate pain. 11/06/19   Mancel Bale, MD  ondansetron (ZOFRAN) 8 MG tablet Take 1 tablet (8 mg total) by mouth every 8 (eight) hours as needed for nausea or vomiting. 11/06/19   Mancel Bale, MD  predniSONE (DELTASONE) 20 MG tablet Take 2 tablets daily with breakfast. 06/06/19   Wallis Bamberg, PA-C  promethazine-dextromethorphan (PROMETHAZINE-DM) 6.25-15 MG/5ML syrup Take 5 mLs by mouth at bedtime as needed for cough. 06/06/19   Wallis Bamberg, PA-C    Allergies    Penicillins  Review of Systems   Review of Systems  Constitutional: Negative for fever.  Skin: Negative for rash.    Physical Exam Updated Vital Signs BP 126/85 (BP Location: Left Arm)   Pulse 81   Temp 98 F (36.7 C) (Oral)   Resp 20   Wt 117.9 kg   SpO2 100%   BMI 40.71 kg/m   Physical Exam Vitals and nursing note reviewed.  Constitutional:      General: She is not in acute distress.    Appearance: She is well-developed and  well-nourished.  HENT:     Head: Normocephalic and atraumatic.     Nose: No congestion or rhinorrhea.  Eyes:     Extraocular Movements: Extraocular movements intact.     Conjunctiva/sclera: Conjunctivae normal.     Pupils: Pupils are equal, round, and reactive to light.  Cardiovascular:     Rate and Rhythm: Normal rate and regular rhythm.     Heart sounds: No murmur heard.   Pulmonary:     Effort: Pulmonary effort is normal. No respiratory distress.     Breath sounds: Normal breath sounds.  Abdominal:     Palpations: Abdomen is soft.     Tenderness: There is no abdominal tenderness.  Musculoskeletal:        General: Swelling, tenderness and signs of injury present. No edema. Normal range  of motion.     Cervical back: Neck supple.  Skin:    General: Skin is warm and dry.     Capillary Refill: Capillary refill takes less than 2 seconds.  Neurological:     General: No focal deficit present.     Mental Status: She is alert.  Psychiatric:        Mood and Affect: Mood and affect normal.     ED Results / Procedures / Treatments   Labs (all labs ordered are listed, but only abnormal results are displayed) Labs Reviewed - No data to display  EKG None  Radiology DG Hand Complete Right  Result Date: 07/13/2020 CLINICAL DATA:  Dog bite yesterday with hand wounds. EXAM: RIGHT HAND - COMPLETE 3+ VIEW COMPARISON:  None. FINDINGS: There is no evidence of fracture or dislocation. There is no evidence of arthropathy or other focal bone abnormality. Soft tissues are unremarkable. IMPRESSION: Negative. Electronically Signed   By: Marnee Spring M.D.   On: 07/13/2020 08:01    Procedures Procedures   Medications Ordered in ED Medications  ibuprofen (ADVIL) tablet 400 mg (400 mg Oral Given 07/13/20 0759)    ED Course  I have reviewed the triage vital signs and the nursing notes.  Pertinent labs & imaging results that were available during my care of the patient were reviewed by me and considered in my medical decision making (see chart for details).    MDM Rules/Calculators/A&P                          Patient is overall well appearing with symptoms consistent with an animal bite.  Exam notable for R hand injury.  Cleaned and dressed here.  XR without acute pathology in my interpretation. Motrin for pain.   I have considered the following complications including: nerve, vascular, tendon injury, and other serious bacterial illnesses.  Patient's presentation is not consistent with any of these complications.     Patient provided script for clinda/bactrim with history PCN anaphylaxis.  Return precautions discussed with family prior to discharge and they were advised to follow  with pcp as needed if symptoms worsen or fail to improve.   Final Clinical Impression(s) / ED Diagnoses Final diagnoses:  Dog bite, initial encounter    Rx / DC Orders ED Discharge Orders         Ordered    clindamycin (CLEOCIN) 300 MG capsule  3 times daily        07/13/20 0743    sulfamethoxazole-trimethoprim (BACTRIM DS) 800-160 MG tablet  2 times daily        07/13/20 0743  Charlett Nose, MD 07/13/20 [id number]

## 2020-07-13 NOTE — ED Notes (Signed)
Discharge instructions reviewed. Confirmed understanding and cleaning instructions

## 2020-07-13 NOTE — ED Notes (Signed)
MD at bedside to clean wound

## 2021-09-06 IMAGING — US US ABDOMEN COMPLETE
1 series · 14 of 25 positions shown · non-contrast
Comparison: None.

CLINICAL DATA: 24-year-old female with acute abdominal pain for 2
days.

EXAM:
ABDOMEN ULTRASOUND COMPLETE

[Series 1: us abdomen complete · 14 of 58 slices shown]
[im 1/58]
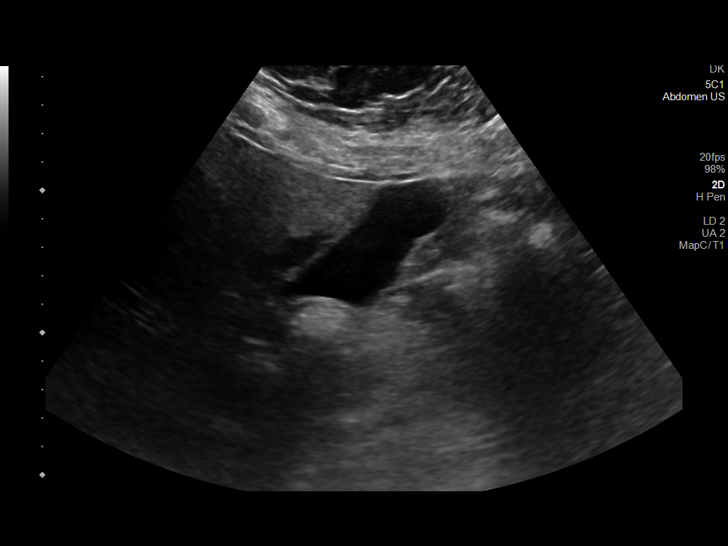
[im 5/58]
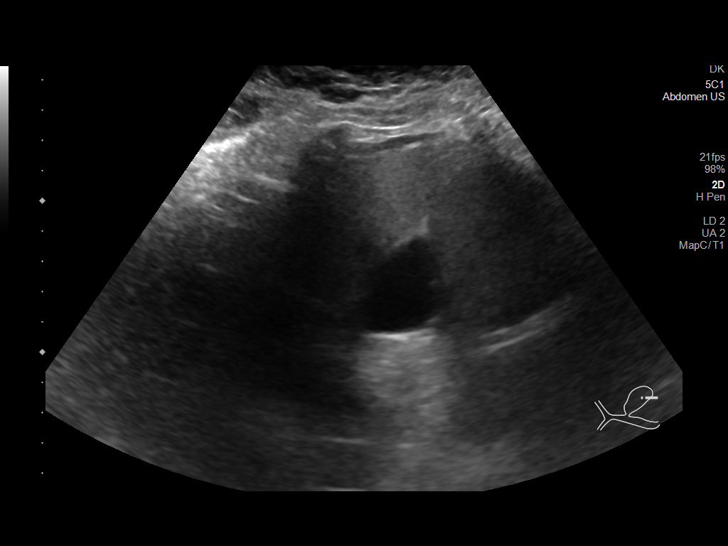
[im 10/58]
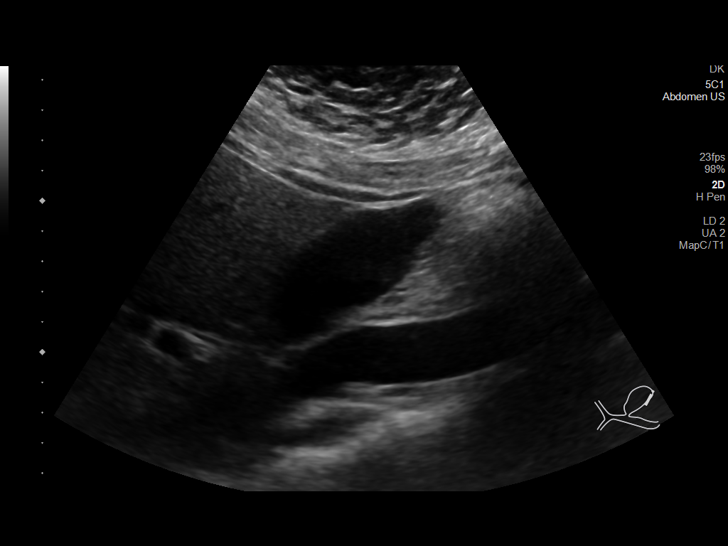
[im 15/58]
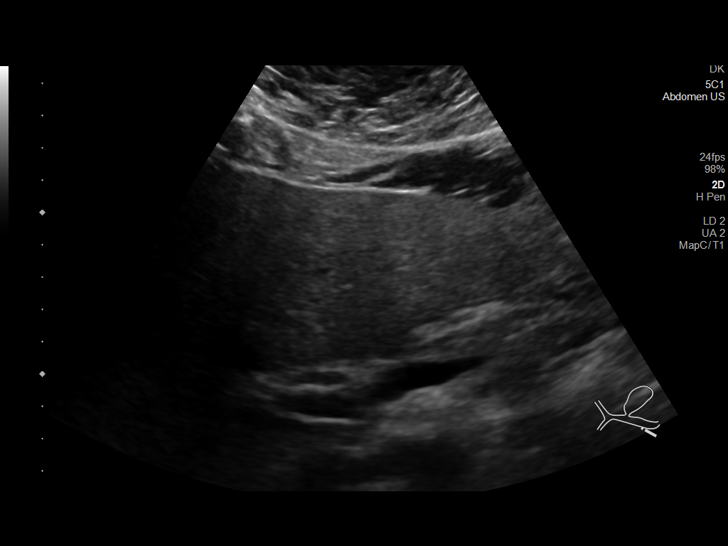
[im 20/58]
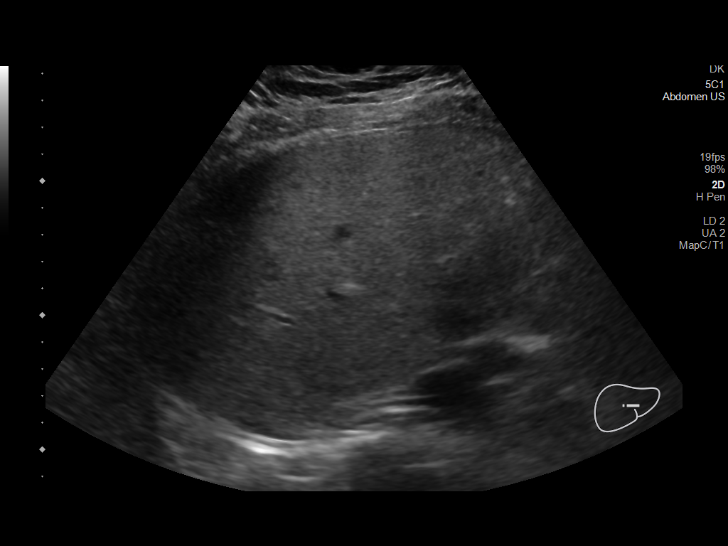
[im 22/58]
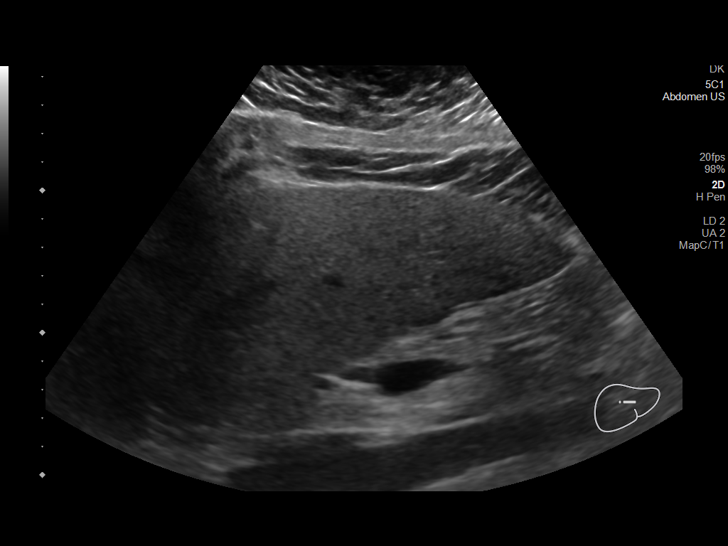
[im 27/58]
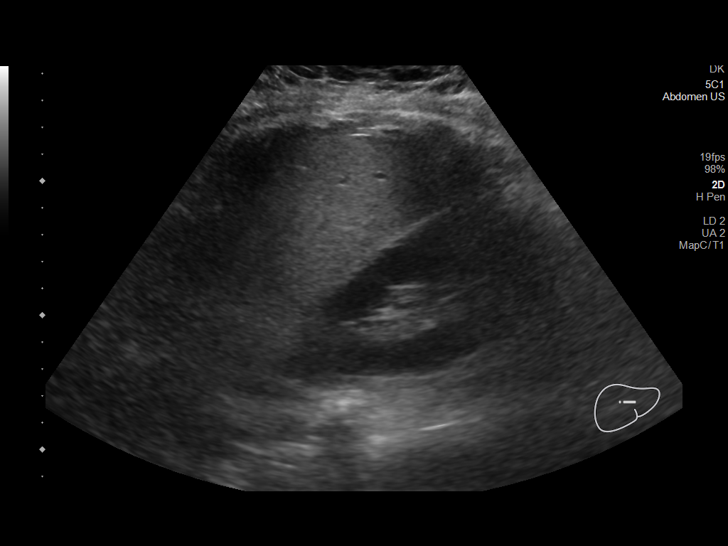
[im 31/58]
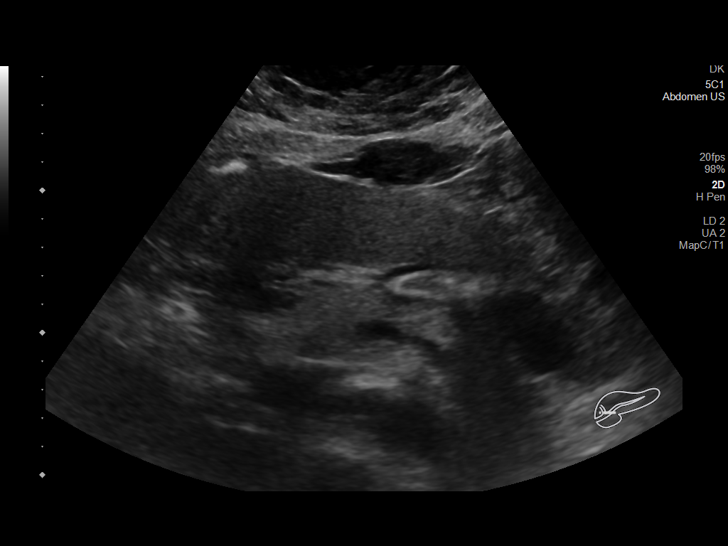
[im 36/58]
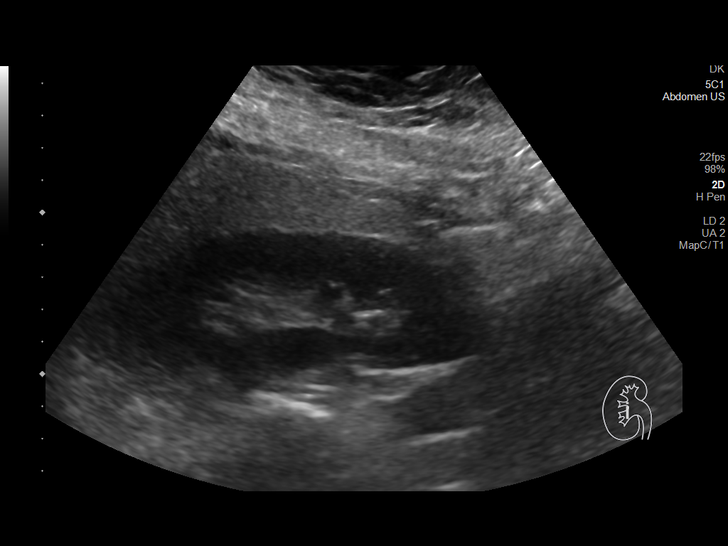
[im 39/58]
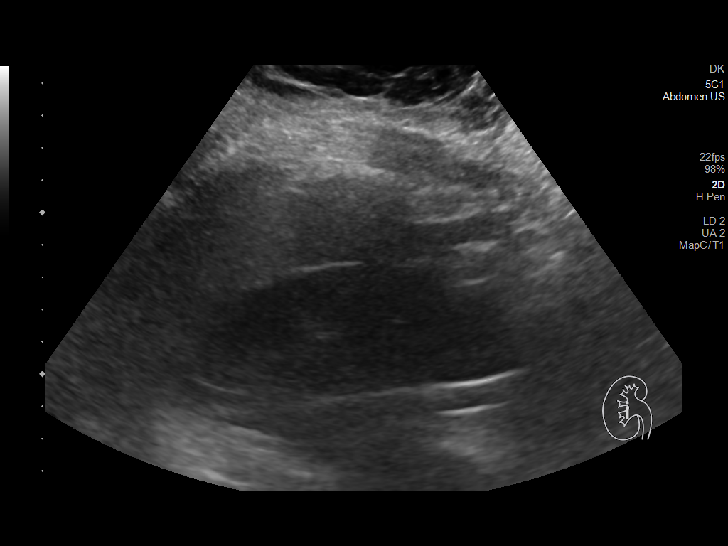
[im 43/58]
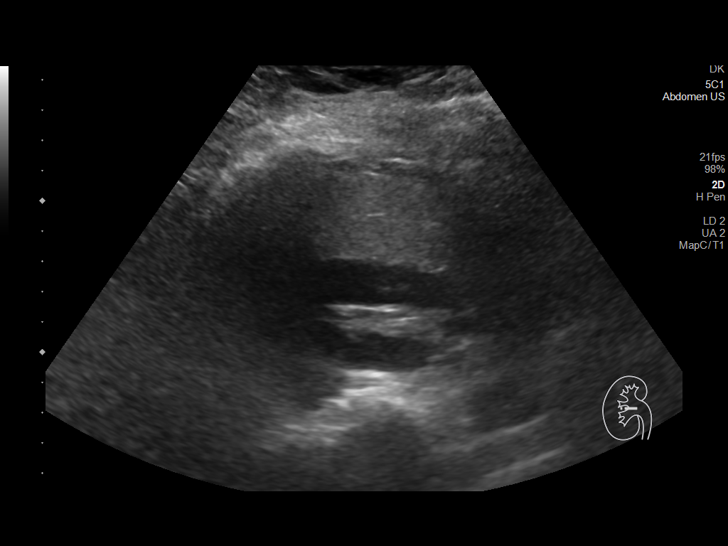
[im 48/58]
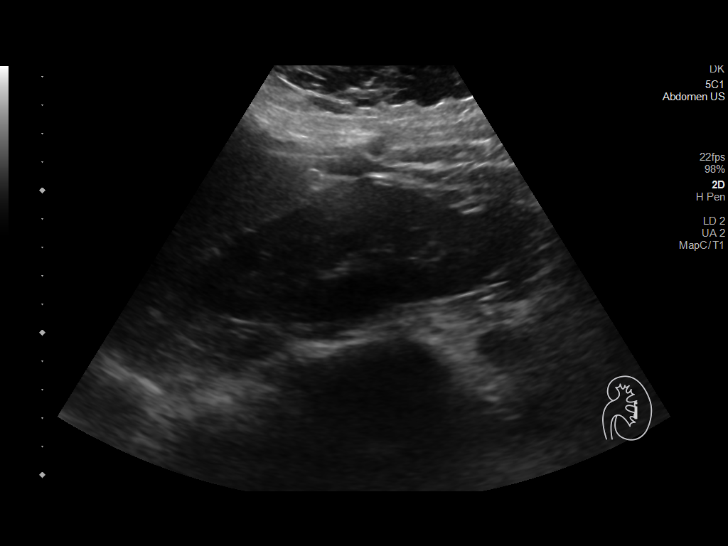
[im 53/58]
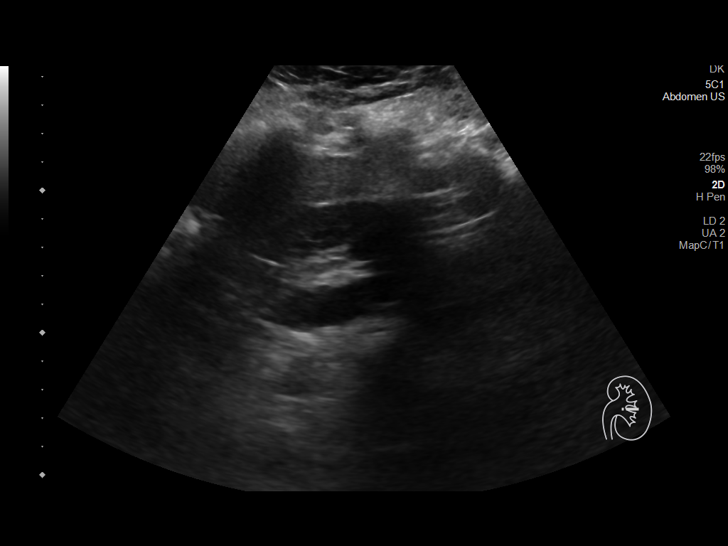
[im 58/58]
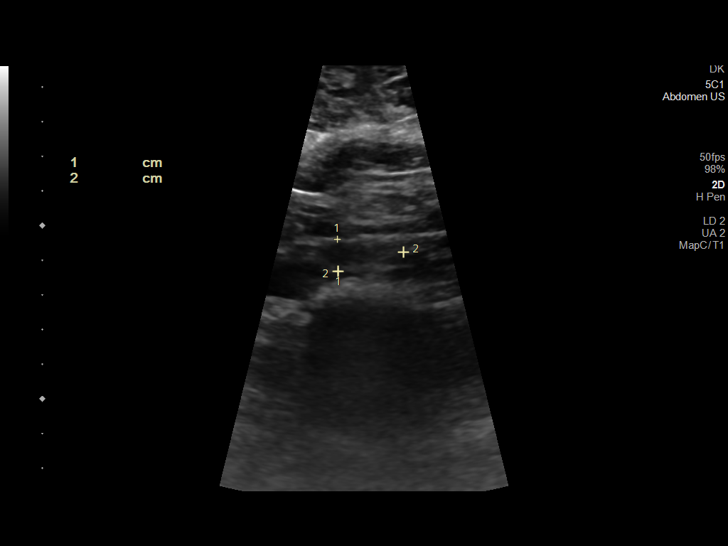

[14 of 25 positions shown; findings below may reference images not displayed]

FINDINGS: Gallbladder: The gallbladder is unremarkable. There is no evidence
of cholelithiasis or acute cholecystitis.

Common bile duct: Diameter: 4 mm. No intrahepatic or extrahepatic
biliary dilatation.

Liver: Increased hepatic echogenicity is compatible with hepatic
steatosis. Focal sparing adjacent to the gallbladder is noted. No
suspicious focal hepatic abnormalities are present. Portal vein is
patent on color Doppler imaging with normal direction of blood flow
towards the liver.

IVC: No abnormality visualized.

Pancreas: Visualized portion unremarkable.

Spleen: Size and appearance within normal limits.

Right Kidney: Length: 12 cm. Echogenicity within normal limits. No
mass or hydronephrosis visualized.

Left Kidney: Length: 12.9 cm. Echogenicity within normal limits. No
mass or hydronephrosis visualized.

Abdominal aorta: No aneurysm visualized.

Other findings: None.
IMPRESSION: 1. No evidence of acute abnormality.  Unremarkable gallbladder.
2. Hepatic steatosis.

## 2022-05-14 IMAGING — DX DG HAND COMPLETE 3+V*R*
3 series · 3 of 3 positions shown · non-contrast
Comparison: None.

CLINICAL DATA: Dog bite yesterday with hand wounds.

EXAM:
RIGHT HAND - COMPLETE 3+ VIEW

[hand pa]
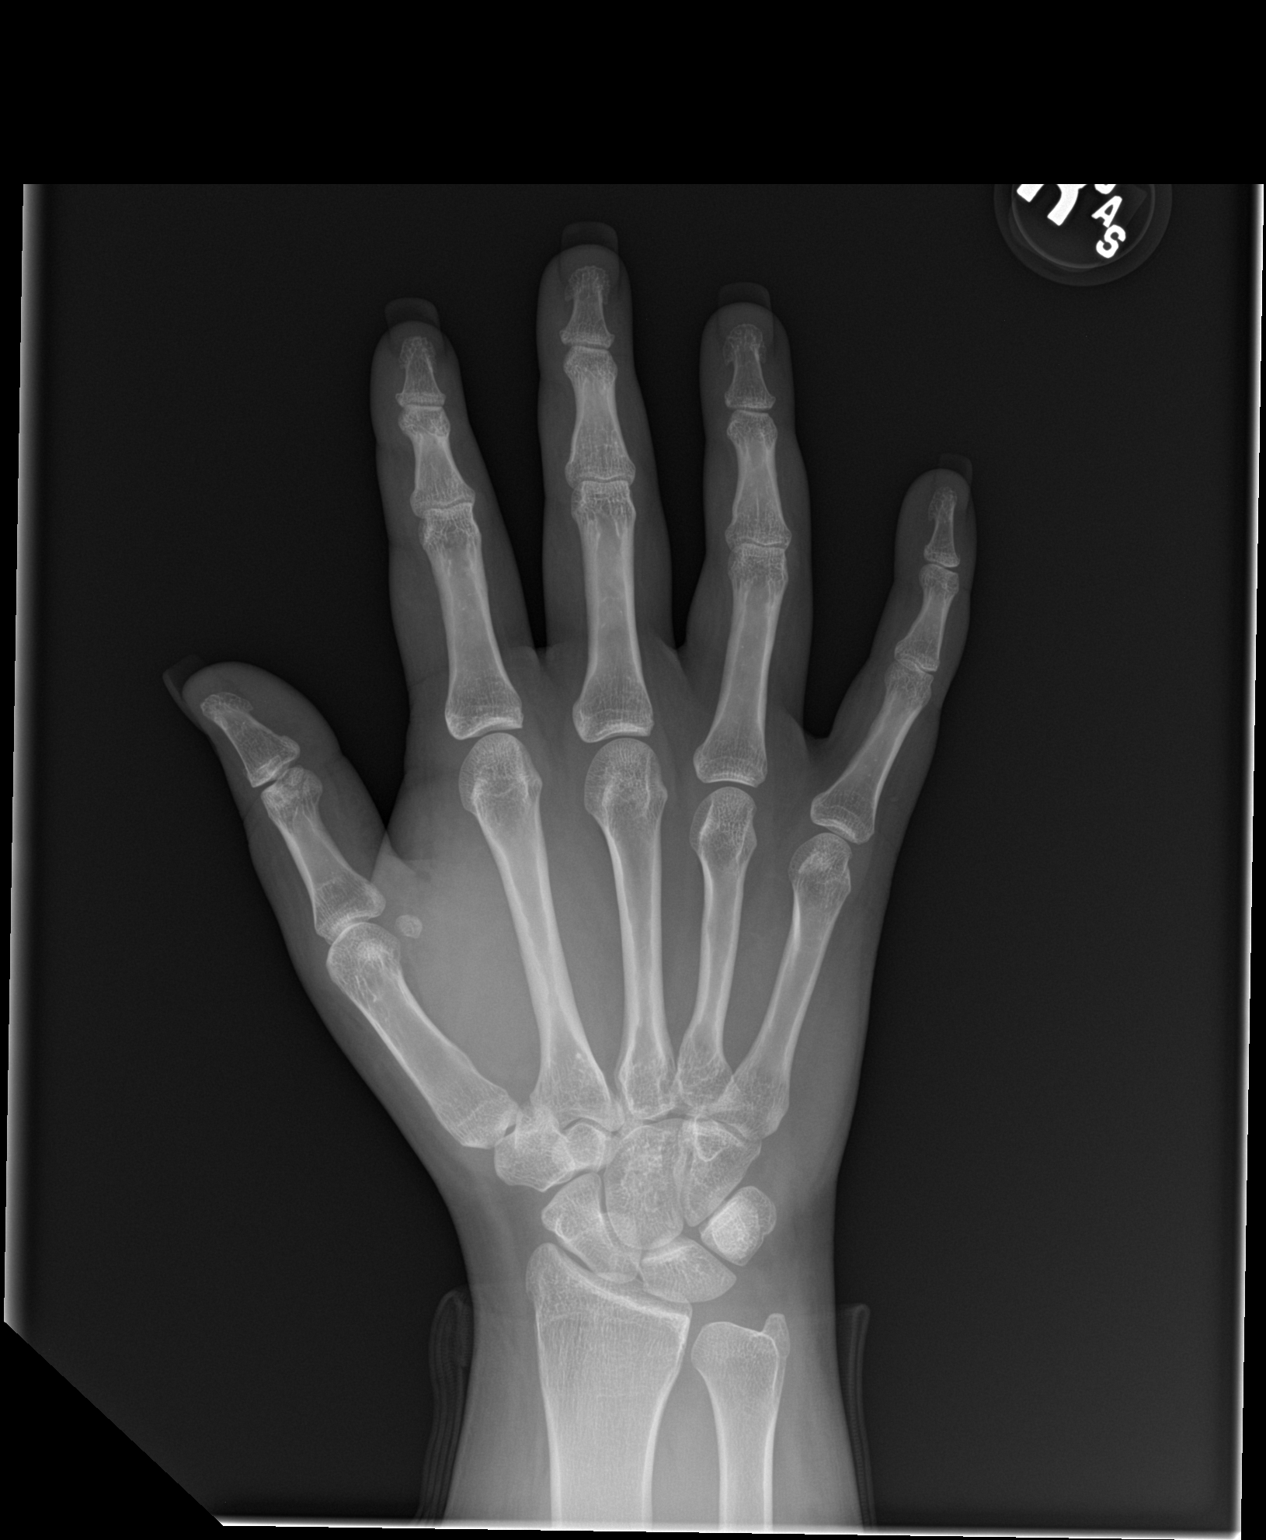

[hand obl]
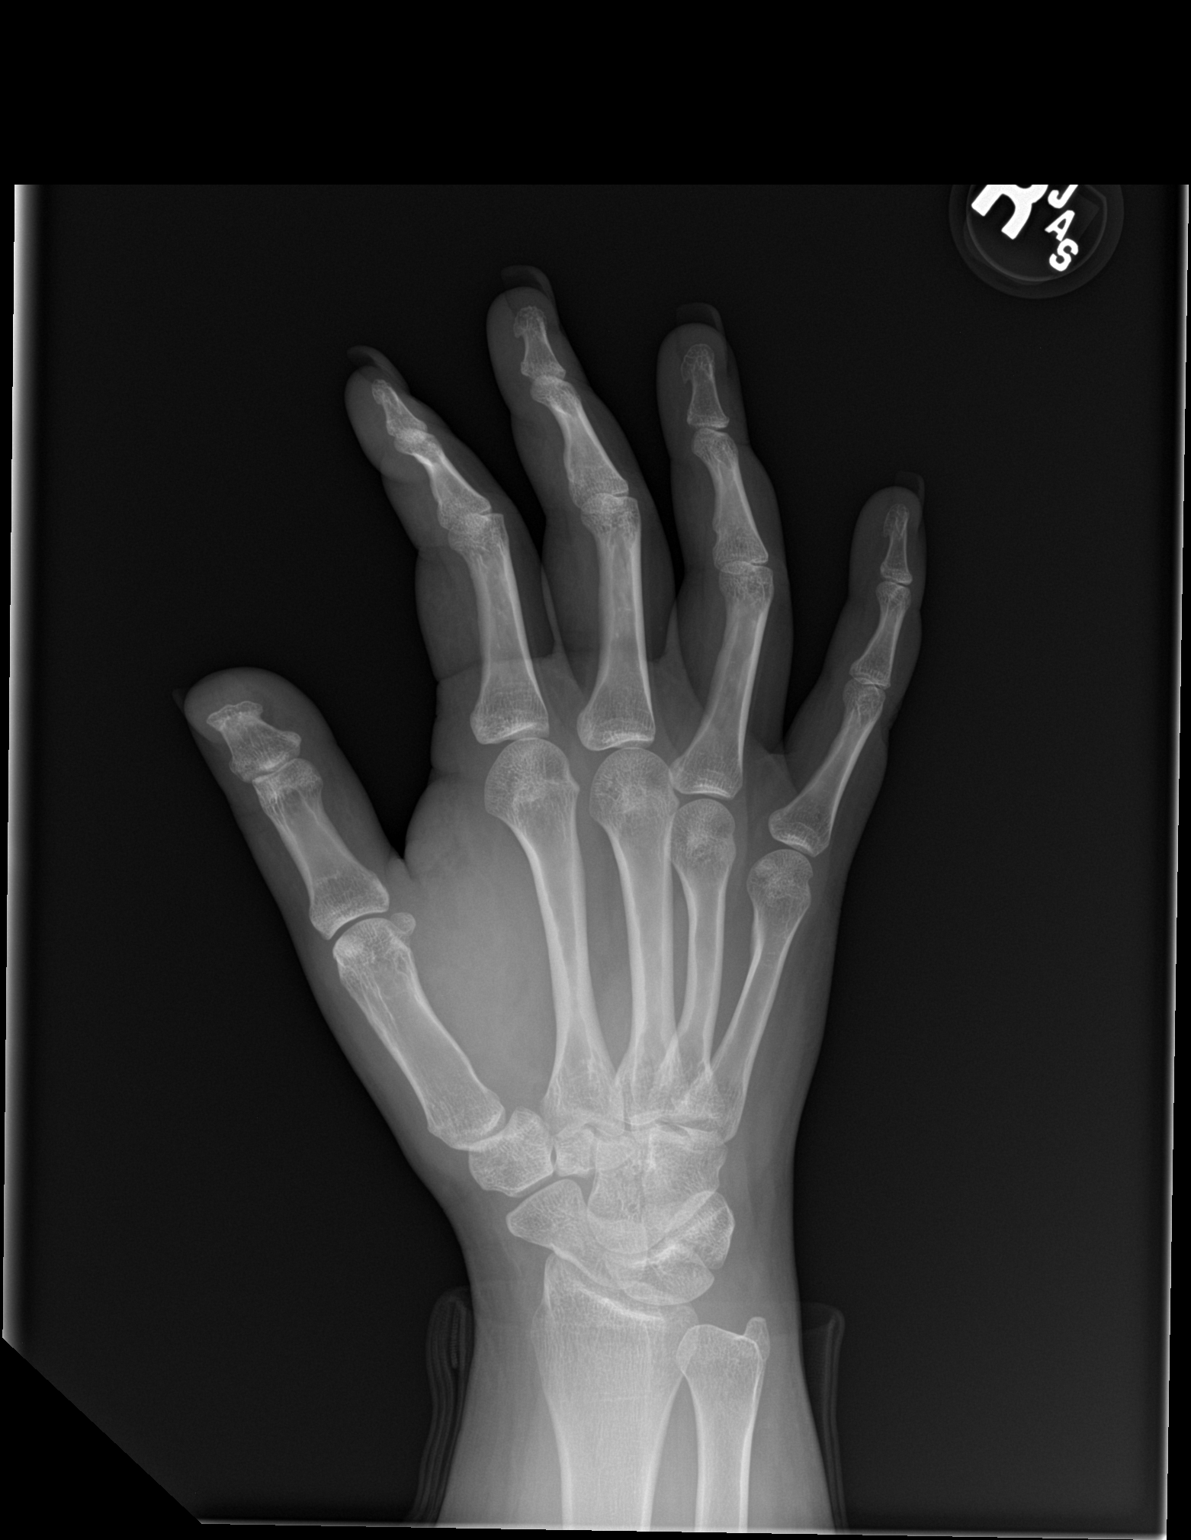

[hand lat]
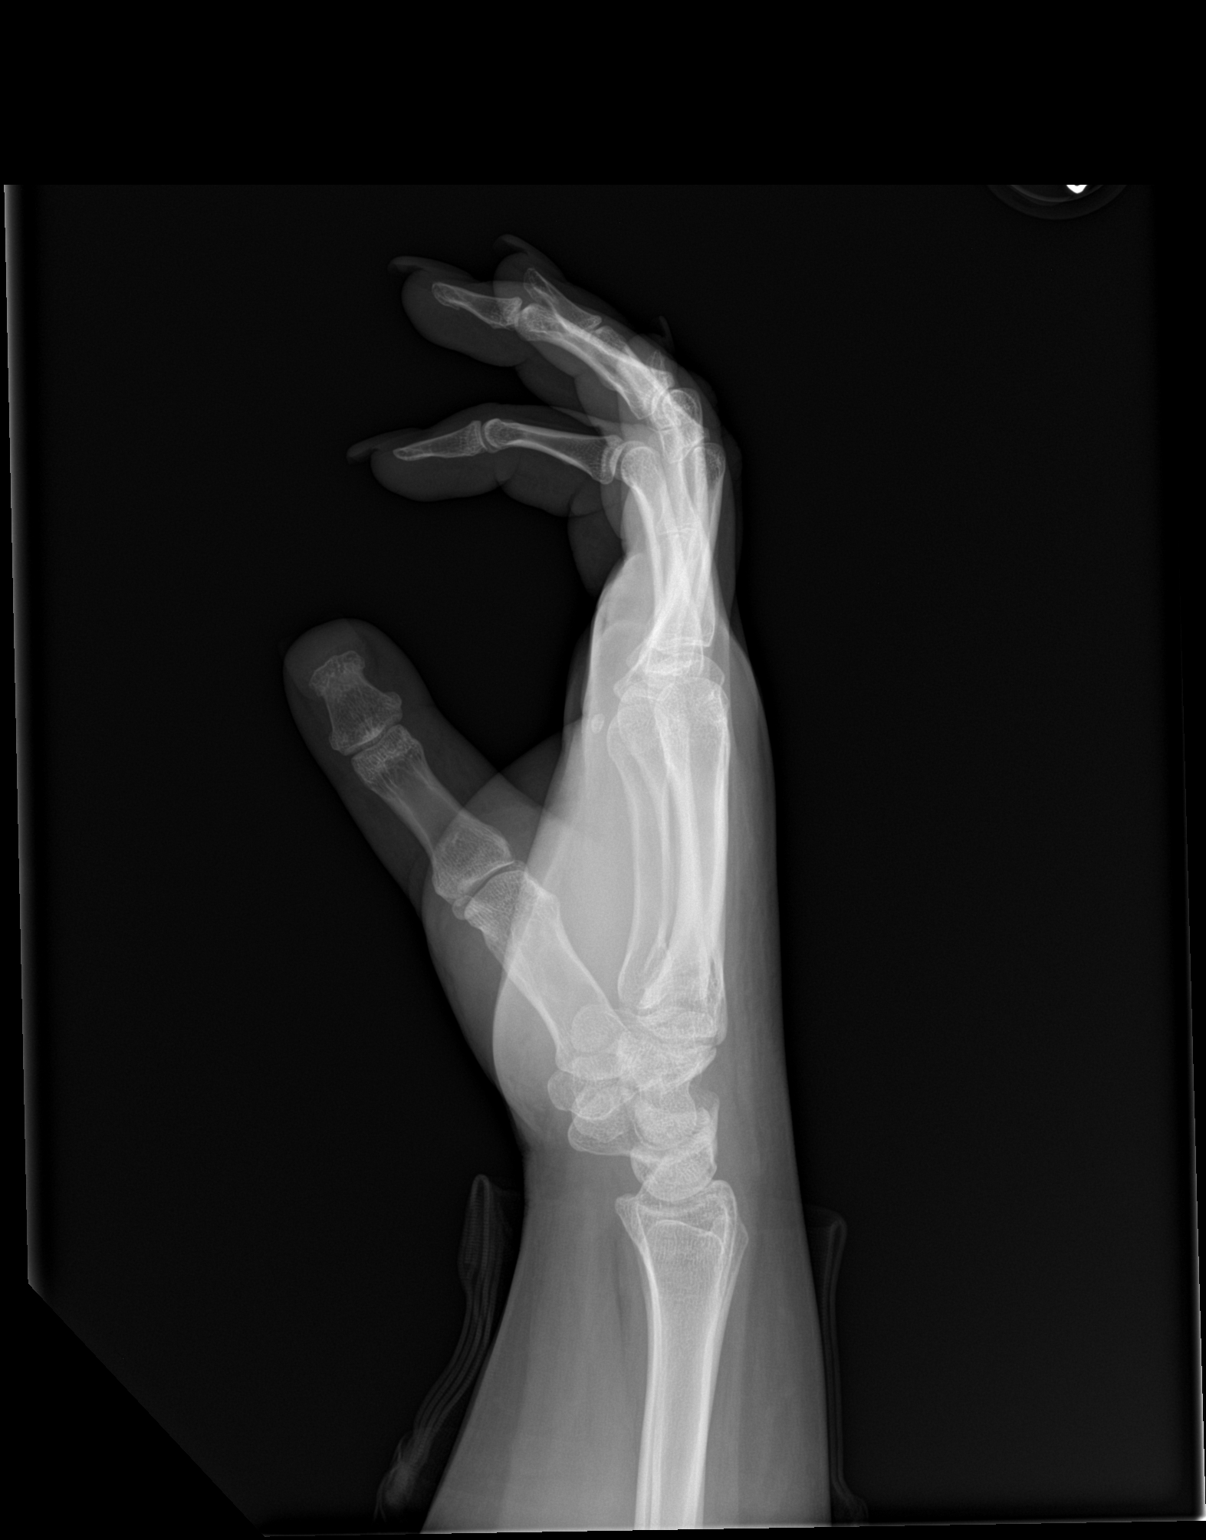

[3 of 3 positions shown; findings below may reference images not displayed]

FINDINGS: There is no evidence of fracture or dislocation. There is no
evidence of arthropathy or other focal bone abnormality. Soft
tissues are unremarkable.
IMPRESSION: Negative.
